# Patient Record
Sex: Male | Born: 1995 | Race: Black or African American | Hispanic: No | Marital: Single | Smoking: Never smoker
Health system: Southern US, Community
[De-identification: ages and names within clinical notes are randomized; demographics above are authoritative.]

---
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
## 2017-01-01 DIAGNOSIS — Z0000 Encounter for general adult medical examination without abnormal findings: Secondary | ICD-10-CM | POA: Diagnosis not present

## 2017-01-01 DIAGNOSIS — Z131 Encounter for screening for diabetes mellitus: Secondary | ICD-10-CM | POA: Diagnosis not present

## 2017-01-01 DIAGNOSIS — Z13220 Encounter for screening for lipoid disorders: Secondary | ICD-10-CM | POA: Diagnosis not present

## 2018-01-20 DIAGNOSIS — J069 Acute upper respiratory infection, unspecified: Secondary | ICD-10-CM | POA: Diagnosis not present

## 2018-11-07 DIAGNOSIS — Z23 Encounter for immunization: Secondary | ICD-10-CM | POA: Diagnosis not present

## 2018-11-07 DIAGNOSIS — Z0000 Encounter for general adult medical examination without abnormal findings: Secondary | ICD-10-CM | POA: Diagnosis not present

## 2018-11-07 DIAGNOSIS — Z1389 Encounter for screening for other disorder: Secondary | ICD-10-CM | POA: Diagnosis not present

## 2019-12-18 ENCOUNTER — Emergency Department (HOSPITAL_COMMUNITY)
Admission: EM | Admit: 2019-12-18 | Discharge: 2019-12-18 | Disposition: A | Discharge status: Home / Self Care | Payer: BC Managed Care – PPO | Attending: Emergency Medicine | Admitting: Emergency Medicine

## 2019-12-18 ENCOUNTER — Encounter (HOSPITAL_COMMUNITY): Payer: Self-pay | Admitting: *Deleted

## 2019-12-18 ENCOUNTER — Other Ambulatory Visit: Payer: Self-pay

## 2019-12-18 DIAGNOSIS — R519 Headache, unspecified: Secondary | ICD-10-CM

## 2019-12-18 MED ORDER — KETOROLAC TROMETHAMINE 30 MG/ML IJ SOLN
30.0000 mg | Freq: Once | INTRAMUSCULAR | Status: AC
  Administered 2019-12-18: 30 mg via INTRAVENOUS
  Filled 2019-12-18: qty 1

## 2019-12-18 MED ORDER — IBUPROFEN 800 MG PO TABS: 800.0000 mg | ORAL_TABLET | Freq: Four times a day (QID) | ORAL | 0 refills | Status: AC | PRN

## 2019-12-18 MED ORDER — SODIUM CHLORIDE 0.9 % IV BOLUS
1000.0000 mL | Freq: Once | INTRAVENOUS | Status: AC
  Administered 2019-12-18: 10:00:00 1000 mL via INTRAVENOUS

## 2019-12-18 NOTE — ED Triage Notes (Signed)
The pt has had a headache since 2100 with sl nausea  Does not usually have headaches

## 2019-12-18 NOTE — Discharge Instructions (Signed)
Return here as needed.  If you continue to have headaches I would advise following up with the specialist provided.  Increase your fluid intake and rest as much as possible.

## 2019-12-18 NOTE — ED Provider Notes (Signed)
[facility] EMERGENCY DEPARTMENT Provider Note   CSN: [id number] Arrival date & time: 12/18/19  2426     History Chief Complaint  Patient presents with  . Headache    Alan Lyons is a 24 y.o. male.  HPI Patient presents to the emergency department with headache that started yesterday morning around 9 AM.  The patient states he took some ibuprofen with some relief of his symptoms.  The patient states he did have some light sensitivity.  Patient states that nothing seems to make the condition better or worse.  Patient states that he normally does not get any headaches.  Patient states that he has not had any fevers, nausea, vomiting, weakness, dizziness, blurred vision, neck pain, back pain, chest pain, shortness of breath or syncope.    History reviewed. No pertinent past medical history.  There are no problems to display for this patient.   History reviewed. No pertinent surgical history.     No family history on file.  Social History   Tobacco Use  . Smoking status: Never Smoker  . Smokeless tobacco: Never Used  Substance Use Topics  . Alcohol use: Never    Alcohol/week: 0.0 standard drinks  . Drug use: Not on file    Home Medications Prior to Admission medications   Not on File    Allergies    Patient has no known allergies.  Review of Systems   Review of Systems All other systems negative except as documented in the HPI. All pertinent positives and negatives as reviewed in the HPI. Physical Exam Updated Vital Signs BP 126/79 (BP Location: Left Arm)   Pulse 82   Temp 99 F (37.2 C) (Oral)   Resp 20   Ht 5\' 10"  (1.778 m)   Wt 83.9 kg   SpO2 97%   BMI 26.54 kg/m   Physical Exam Vitals and nursing note reviewed.  Constitutional:      General: He is not in acute distress.    Appearance: He is well-developed.  HENT:     Head: Normocephalic and atraumatic.  Eyes:     General: No visual field deficit.    Pupils: Pupils  are equal, round, and reactive to light.  Cardiovascular:     Rate and Rhythm: Normal rate and regular rhythm.     Heart sounds: Normal heart sounds. No murmur. No friction rub. No gallop.   Pulmonary:     Effort: Pulmonary effort is normal. No respiratory distress.     Breath sounds: Normal breath sounds. No wheezing.  Musculoskeletal:     Cervical back: Normal range of motion and neck supple.  Skin:    General: Skin is warm and dry.     Capillary Refill: Capillary refill takes less than 2 seconds.     Findings: No erythema or rash.  Neurological:     Mental Status: He is alert and oriented to person, place, and time.     GCS: GCS eye subscore is 4. GCS verbal subscore is 5. GCS motor subscore is 6.     Cranial Nerves: No facial asymmetry.     Sensory: No sensory deficit.     Motor: No weakness or abnormal muscle tone.     Coordination: Coordination normal.     Deep Tendon Reflexes: Reflexes normal.  Psychiatric:        Mood and Affect: Mood normal.        Speech: Speech normal.        Behavior:  Behavior normal.     ED Results / Procedures / Treatments   Labs (all labs ordered are listed, but only abnormal results are displayed) Labs Reviewed - No data to display  EKG None  Radiology No results found.  Procedures Procedures (including critical care time)  Medications Ordered in ED Medications  sodium chloride 0.9 % bolus 1,000 mL (1,000 mLs Intravenous New Bag/Given 12/18/19 0931)  ketorolac (TORADOL) 30 MG/ML injection 30 mg (30 mg Intravenous Given 12/18/19 0931)    ED Course  I have reviewed the triage vital signs and the nursing notes.  Pertinent labs & imaging results that were available during my care of the patient were reviewed by me and considered in my medical decision making (see chart for details).    MDM Rules/Calculators/A&P                     The patient will be treated for his headache..  The patient is stable here in the emergency department.   He is feeling better following the IV medications and fluids.  Patient is given neurological follow-up for any worsening in these headaches.  Told to take Motrin for his headache. Final Clinical Impression(s) / ED Diagnoses Final diagnoses:  None    Rx / DC Orders ED Discharge Orders    None       Charlestine Night, PA-C 12/19/19 1523    Terald Sleeper, MD 12/19/19 1843

## 2019-12-18 NOTE — ED Notes (Signed)
Got patient on the monitor patient is resting with call bell in reach  ?

## 2019-12-25 DIAGNOSIS — Z131 Encounter for screening for diabetes mellitus: Secondary | ICD-10-CM | POA: Diagnosis not present

## 2019-12-25 DIAGNOSIS — J069 Acute upper respiratory infection, unspecified: Secondary | ICD-10-CM | POA: Diagnosis not present

## 2019-12-25 DIAGNOSIS — Z0000 Encounter for general adult medical examination without abnormal findings: Secondary | ICD-10-CM | POA: Diagnosis not present

## 2019-12-25 DIAGNOSIS — Z13220 Encounter for screening for lipoid disorders: Secondary | ICD-10-CM | POA: Diagnosis not present

## 2019-12-25 DIAGNOSIS — E559 Vitamin D deficiency, unspecified: Secondary | ICD-10-CM | POA: Diagnosis not present

## 2020-08-01 DIAGNOSIS — L853 Xerosis cutis: Secondary | ICD-10-CM | POA: Diagnosis not present

## 2020-08-01 DIAGNOSIS — L700 Acne vulgaris: Secondary | ICD-10-CM | POA: Diagnosis not present

## 2020-08-01 DIAGNOSIS — L74511 Primary focal hyperhidrosis, face: Secondary | ICD-10-CM | POA: Diagnosis not present

## 2020-09-02 DIAGNOSIS — Z13220 Encounter for screening for lipoid disorders: Secondary | ICD-10-CM | POA: Diagnosis not present

## 2020-09-02 DIAGNOSIS — Z0000 Encounter for general adult medical examination without abnormal findings: Secondary | ICD-10-CM | POA: Diagnosis not present

## 2020-09-17 ENCOUNTER — Other Ambulatory Visit: Payer: Self-pay

## 2020-09-17 ENCOUNTER — Encounter (HOSPITAL_COMMUNITY): Payer: Self-pay

## 2020-09-17 ENCOUNTER — Ambulatory Visit (HOSPITAL_COMMUNITY)
Admission: EM | Admit: 2020-09-17 | Discharge: 2020-09-17 | Disposition: A | Discharge status: Home / Self Care | Payer: BC Managed Care – PPO | Attending: Family Medicine | Admitting: Family Medicine

## 2020-09-17 DIAGNOSIS — J029 Acute pharyngitis, unspecified: Secondary | ICD-10-CM | POA: Diagnosis not present

## 2020-09-17 DIAGNOSIS — Z20822 Contact with and (suspected) exposure to covid-19: Secondary | ICD-10-CM | POA: Diagnosis not present

## 2020-09-17 LAB — SARS CORONAVIRUS 2 (TAT 6-24 HRS): SARS Coronavirus 2: NEGATIVE

## 2020-09-17 LAB — POCT RAPID STREP A, ED / UC: Streptococcus, Group A Screen (Direct): NEGATIVE

## 2020-09-17 NOTE — Discharge Instructions (Addendum)
You may use over the counter ibuprofen or acetaminophen as needed.  For a sore throat, over the counter products such as Colgate Peroxyl Mouth Sore Rinse or Chloraseptic Sore Throat Spray may provide some temporary relief. Your rapid strep test was negative today. We have sent your throat swab for culture and will let you know of any positive results.  You have been tested for COVID-19 today. If your test returns positive, you will receive a phone call from [facility] regarding your results. Negative test results are not called. Both positive and negative results area always visible on MyChart. If you do not have a MyChart account, sign up instructions are provided in your discharge papers. Please do not hesitate to contact us should you have questions or concerns.  

## 2020-09-17 NOTE — ED Provider Notes (Signed)
[facility]   [id number] 09/17/20 Arrival Time: 1544  ASSESSMENT & PLAN:  1. Sore throat       Discharge Instructions      You may use over the counter ibuprofen or acetaminophen as needed.   For a sore throat, over the counter products such as Colgate Peroxyl Mouth Sore Rinse or Chloraseptic Sore Throat Spray may provide some temporary relief.  Your rapid strep test was negative today. We have sent your throat swab for culture and will let you know of any positive results.  You have been tested for COVID-19 today. If your test returns positive, you will receive a phone call from [facility] regarding your results. Negative test results are not called. Both positive and negative results area always visible on MyChart. If you do not have a MyChart account, sign up instructions are provided in your discharge papers. Please do not hesitate to contact us should you have questions or concerns.        COVID-19 testing sent. See letter/work note on file for self-isolation guidelines. OTC symptom care as needed.    Follow-up Information    [facility].   Specialty: Urgent Care Why: If worsening or failing to improve as anticipated. Contact information: [address] [place] [address] [phone]              Reviewed expectations re: course of current medical issues. Questions answered. Outlined signs and symptoms indicating need for more acute intervention. Understanding verbalized. After Visit Summary given.   SUBJECTIVE: History from: patient. Alan Lyons is a 24 y.o. male who reports ST. Abrupt onset this am. Denies: runny nose, congestion, fever, cough, difficulty breathing and headache. Normal PO intake without n/v/d.    OBJECTIVE:  Vitals:   09/17/20 1639  BP: 132/74  Pulse: 63  Resp: 16  Temp: 98.3 F (36.8 C)  SpO2: 99%    General appearance: alert; no distress Eyes:  PERRLA; EOMI; conjunctiva normal HENT: [place]; AT; without nasal congestion; throat with irritation/cobblestoning Neck: supple without LAD Lungs: speaks full sentences without difficulty; unlabored Extremities: no edema Skin: warm and dry Neurologic: normal gait Psychological: alert and cooperative; normal mood and affect  Labs: Results for orders placed or performed during the hospital encounter of 09/17/20  POCT Rapid Strep A  Result Value Ref Range   Streptococcus, Group A Screen (Direct) NEGATIVE NEGATIVE   Labs Reviewed  SARS CORONAVIRUS 2 (TAT 6-24 HRS)  CULTURE, GROUP A STREP [facility])  POCT RAPID STREP A, ED / UC    No Known Allergies  History reviewed. No pertinent past medical history. Social History   Socioeconomic History  . Marital status: Single    Spouse name: Not on file  . Number of children: Not on file  . Years of education: Not on file  . Highest education level: Not on file  Occupational History  . Not on file  Tobacco Use  . Smoking status: Never Smoker  . Smokeless tobacco: Never Used  Substance and Sexual Activity  . Alcohol use: Never    Alcohol/week: 0.0 standard drinks  . Drug use: Not on file  . Sexual activity: Not on file  Other Topics Concern  . Not on file  Social History Narrative  . Not on file   Social Determinants of Health   Financial Resource Strain:   . Difficulty of Paying Living Expenses: Not on file  Food Insecurity:   . Worried About Running  Out of Food in the Last Year: Not on file  . Ran Out of Food in the Last Year: Not on file  Transportation Needs:   . Lack of Transportation (Medical): Not on file  . Lack of Transportation (Non-Medical): Not on file  Physical Activity:   . Days of Exercise per Week: Not on file  . Minutes of Exercise per Session: Not on file  Stress:   . Feeling of Stress : Not on file  Social Connections:   . Frequency of Communication with Friends and Family: Not on file  . Frequency of Social  Gatherings with Friends and Family: Not on file  . Attends Religious Services: Not on file  . Active Member of Clubs or Organizations: Not on file  . Attends Banker Meetings: Not on file  . Marital Status: Not on file  Intimate Partner Violence:   . Fear of Current or Ex-Partner: Not on file  . Emotionally Abused: Not on file  . Physically Abused: Not on file  . Sexually Abused: Not on file   History reviewed. No pertinent family history. History reviewed. No pertinent surgical history.   Mardella Layman, MD 09/17/20 [phone]

## 2020-09-17 NOTE — ED Triage Notes (Signed)
Pt presents with sore throat since this morning. Pain is worse when swallow. Denies fever, cough, sob.

## 2020-09-19 LAB — CULTURE, GROUP A STREP (THRC)

## 2020-09-20 LAB — CULTURE, GROUP A STREP (THRC)

## 2020-09-30 DIAGNOSIS — J343 Hypertrophy of nasal turbinates: Secondary | ICD-10-CM | POA: Diagnosis not present

## 2020-09-30 DIAGNOSIS — Q381 Ankyloglossia: Secondary | ICD-10-CM | POA: Diagnosis not present

## 2020-10-14 DIAGNOSIS — L700 Acne vulgaris: Secondary | ICD-10-CM | POA: Diagnosis not present

## 2020-10-14 DIAGNOSIS — L74510 Primary focal hyperhidrosis, axilla: Secondary | ICD-10-CM | POA: Diagnosis not present

## 2020-12-10 DIAGNOSIS — Z0000 Encounter for general adult medical examination without abnormal findings: Secondary | ICD-10-CM | POA: Diagnosis not present

## 2020-12-10 DIAGNOSIS — Z13220 Encounter for screening for lipoid disorders: Secondary | ICD-10-CM | POA: Diagnosis not present

## 2020-12-10 DIAGNOSIS — Z131 Encounter for screening for diabetes mellitus: Secondary | ICD-10-CM | POA: Diagnosis not present

## 2020-12-10 DIAGNOSIS — E559 Vitamin D deficiency, unspecified: Secondary | ICD-10-CM | POA: Diagnosis not present

## 2020-12-10 DIAGNOSIS — Z23 Encounter for immunization: Secondary | ICD-10-CM | POA: Diagnosis not present

## 2020-12-11 DIAGNOSIS — Z1152 Encounter for screening for COVID-19: Secondary | ICD-10-CM | POA: Diagnosis not present
